# Patient Record
Sex: Male | Born: 1945
Health system: Western US, Academic
[De-identification: ages and names within clinical notes are randomized; demographics above are authoritative.]

---

## 2016-09-08 ENCOUNTER — Ambulatory Visit: Payer: MEDICARE

## 2016-09-08 DIAGNOSIS — H1851 Endothelial corneal dystrophy: Secondary | ICD-10-CM

## 2016-09-08 DIAGNOSIS — H259 Unspecified age-related cataract: Secondary | ICD-10-CM

## 2017-09-21 ENCOUNTER — Ambulatory Visit: Payer: BLUE CROSS/BLUE SHIELD

## 2021-03-16 IMAGING — MR MULTI PARAMETRIC MRI PROSTATE W/O AND W
13 series · 48 of 48 positions shown · IV contrast (gadavist)
Comparison: None

________________________________________________________________________________________________ 
MULTI PARAMETRIC MRI PROSTATE W/O AND W, 03/16/2021 [DATE]: 
CLINICAL INDICATION: Elevated PSA
TECHNIQUE: Multiple parametric sequences were performed. Patient was scanned on 
a 3T magnet. The patients eGFR was calculated to be 49.1 mL/min/1.73 m2 using 
the i-STAT device. 
Pre-contrast: T1 axial of the entire pelvis. T2 sagittal, axial and coronal, T1 
axial, acquired of the prostate. Diffusion with multiple B values of 3888, 5711 
calculated ADC value for mapping. 
Post contrast: Rapid sequence dynamic and axial planes through the prostate and 
seminal vesicles, T1 axial with fat sat of the entire pelvis. 3-D renderings 
were reconstructed on an independent workstation. The images were also evaluated 
with Dyna CAD computer aided detection. 8 of Gadavist were injected 
intravenously with the injector at a rate of 2.0 mL per second. As per [HOSPITAL] guidelines 3-D reconstructions are performed with 
concurrent physician supervision.

[Series 101: survey-include kidney(hilum) · axial · 10.0mm · 1.34mm/px · 1 of 14 slices shown]
[im 1/14]
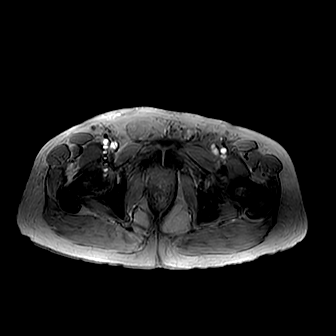

[Series 201: t1w_tse_ax · axial · 6.0mm · 0.36mm/px · 1 of 36 slices shown]
[im 1/36]
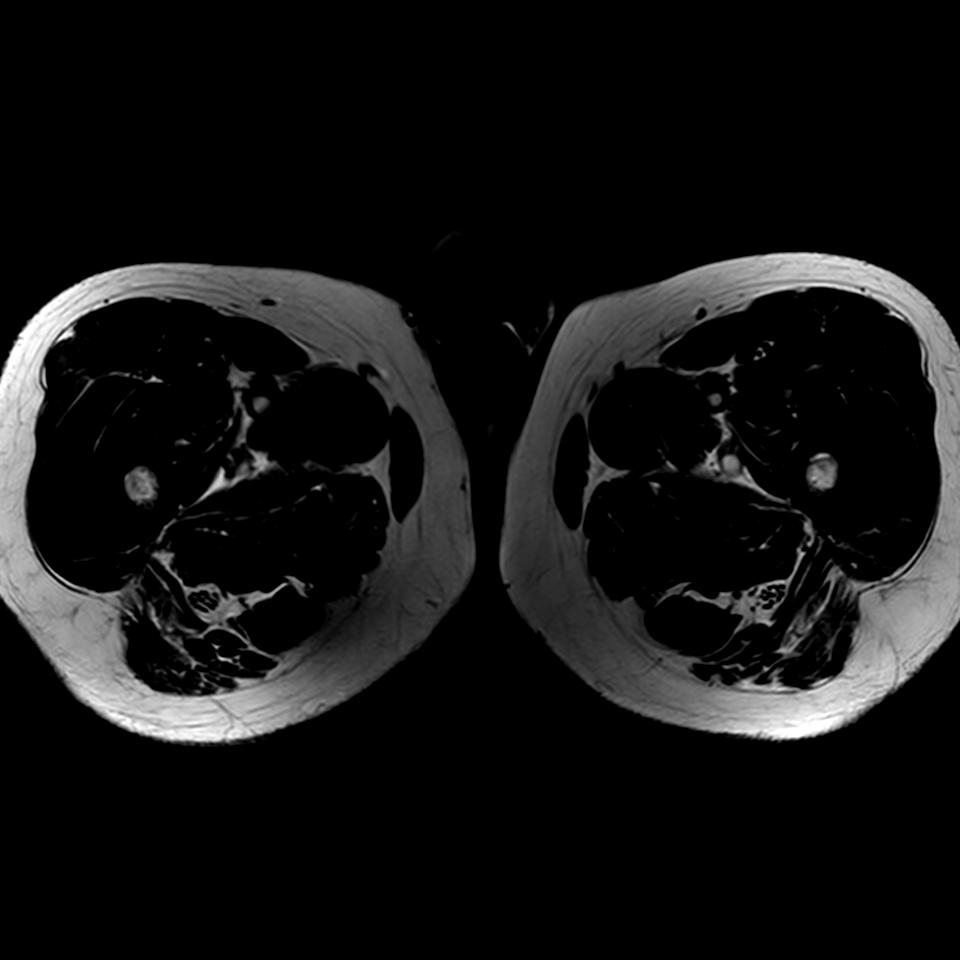

[Series 301: t2w sag · sagittal · 3.0mm · 0.45mm/px · 1 of 30 slices shown]
[im 1/30]
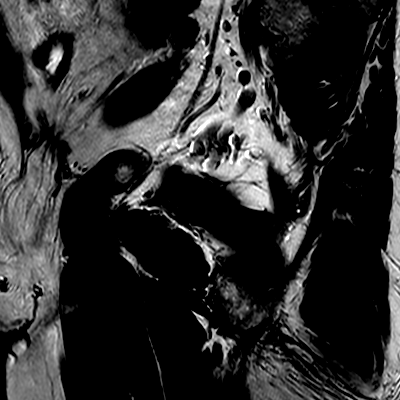

[Series 401: t2w cor · coronal · 3.0mm · 0.42mm/px · 1 of 30 slices shown]
[im 1/30]
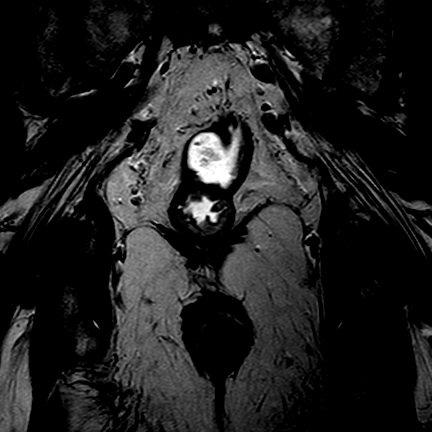

[Series 501: t2w ax · axial · 3.0mm · 0.27mm/px · 1 of 32 slices shown]
[im 1/32]
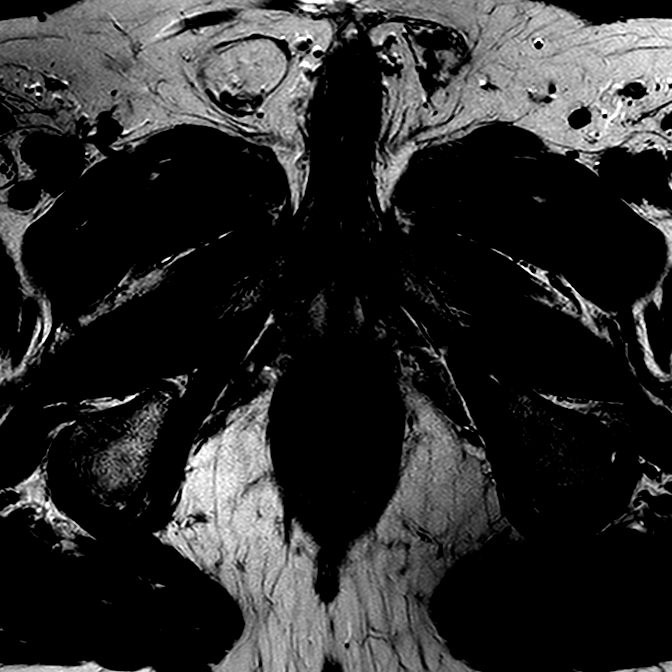

[Series 601: new-dwi_3b* 3mm* · axial · 3.0mm · 1.28mm/px · 1 of 64 slices shown]
[im 1/64]
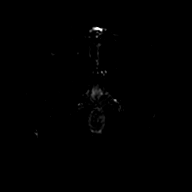

[Series 602: ADC · axial · 3.0mm · 1.28mm/px · 1 of 32 slices shown (1 of 2)]
[im 1/32]
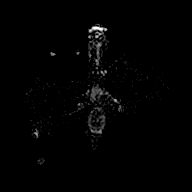

[Series 603: ADC · axial · 3.0mm · 1.28mm/px · 1 of 32 slices shown (2 of 2)]
[im 1/32]
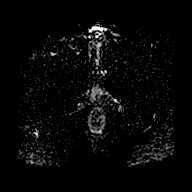

[Series 604: (id) · axial · 3.0mm · 1.28mm/px · 1 of 32 slices shown (1 of 3)]
[im 1/32]
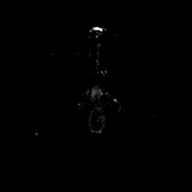

[Series 605: (id) · axial · 3.0mm · 1.28mm/px · 1 of 32 slices shown (2 of 3)]
[im 1/32]
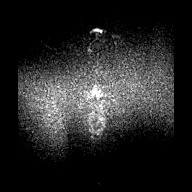

[Series 701: (id) · axial · 3.0mm · 1.28mm/px · 1 of 32 slices shown (3 of 3)]
[im 1/32]
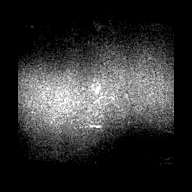

[Series 901: dyn 3mm*(ap) · axial · 3.0mm · 1.38mm/px · z∈[-45,+47]mm · 36 of 1440 slices shown]
[im 1/1440]
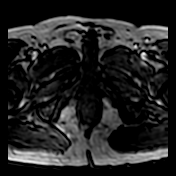
[im 42/1440]
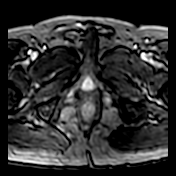
[im 83/1440]
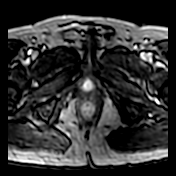
[im 124/1440]
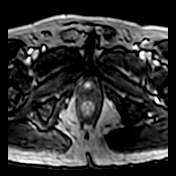
[im 165/1440]
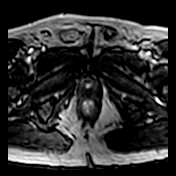
[im 206/1440]
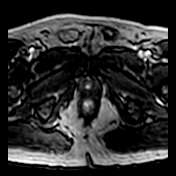
[im 247/1440]
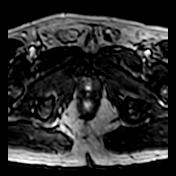
[im 288/1440]
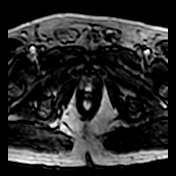
[im 329/1440]
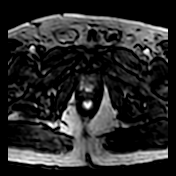
[im 371/1440]
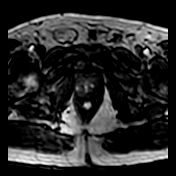
[im 412/1440]
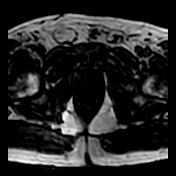
[im 453/1440]
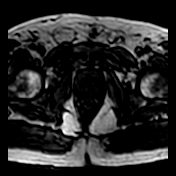
[im 494/1440]
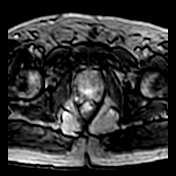
[im 535/1440]
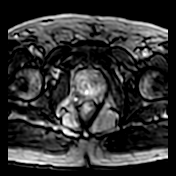
[im 576/1440]
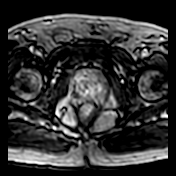
[im 617/1440]
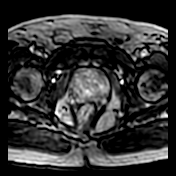
[im 658/1440]
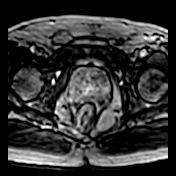
[im 699/1440]
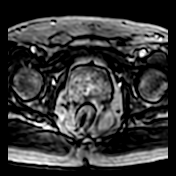
[im 741/1440]
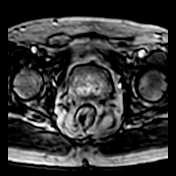
[im 782/1440]
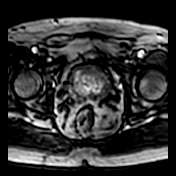
[im 823/1440]
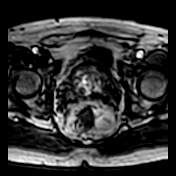
[im 864/1440]
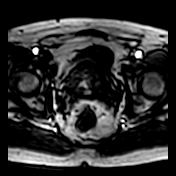
[im 905/1440]
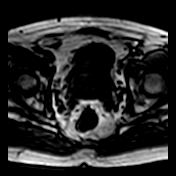
[im 946/1440]
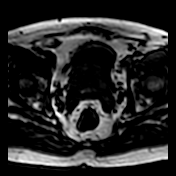
[im 987/1440]
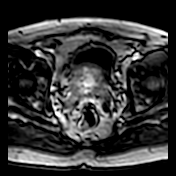
[im 1028/1440]
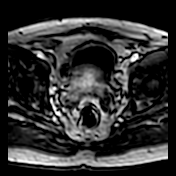
[im 1069/1440]
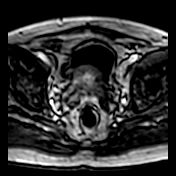
[im 1111/1440]
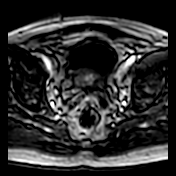
[im 1152/1440]
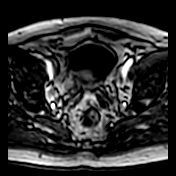
[im 1193/1440]
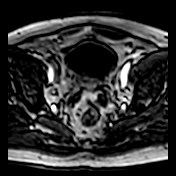
[im 1234/1440]
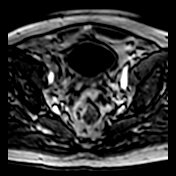
[im 1275/1440]
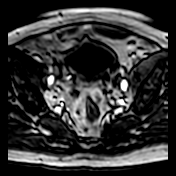
[im 1316/1440]
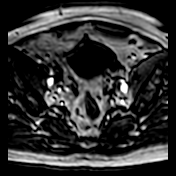
[im 1357/1440]
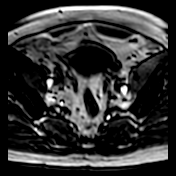
[im 1398/1440]
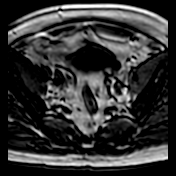
[im 1440/1440]
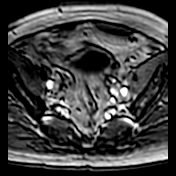

[Series 1002: DIXON · axial · 6.0mm · 0.51mm/px · 1 of 36 slices shown]
[im 1/36]
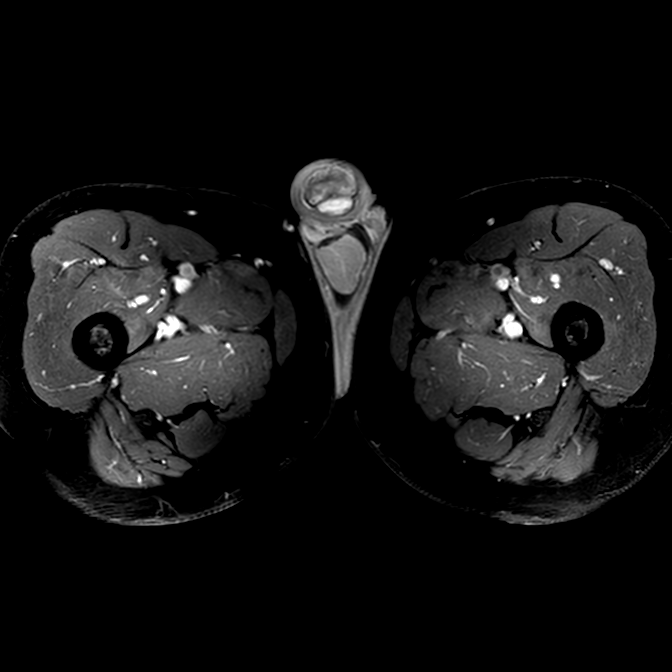

[48 of 48 positions shown; findings below may reference images not displayed]

FINDINGS: VOLUME: 89 cc 
CENTRAL GLAND: Enlarged and nodular in appearance. Elevates the bladder base. No 
areas of restricted diffusion. 
PERIPHERAL ZONE: There is a focus of decreased T2 signal within the lateral 
aspect of the right peripheral zone mid gland level. Measures upwards of 7 mm. 
There is restricted diffusion and flow with washout. Concerning for malignancy. 
PROSTATE CAPSULE: Intact. 
MUSCLE SIDE WALLS: Normal in appearance. 
SEMINAL VESICLES: Normal in appearance. 
BLADDER: Bladder diverticula are identified. Trabeculated bladder. 
LYMPHADENOPATHY: None 
BONES: No osseous abnormality identified. 
ADDITIONAL FINDINGS: Diverticulosis. Degenerative changes.
IMPRESSION: Findings concerning for malignancy right peripheral zone without extracapsular 
extension. 
Bladder diverticula. 
(PI-RADS 4): High (clinically significant cancer is likely to be present).

## 2021-09-25 ENCOUNTER — Ambulatory Visit: Payer: BLUE CROSS/BLUE SHIELD

## 2021-09-25 DIAGNOSIS — J029 Acute pharyngitis, unspecified: Secondary | ICD-10-CM

## 2021-09-25 MED ORDER — AMOXICILLIN-POT CLAVULANATE 875-125 MG PO TABS
1 | ORAL_TABLET | Freq: Two times a day (BID) | ORAL | 0 refills | Status: AC
Start: 2021-09-25 — End: ?

## 2021-09-25 NOTE — Progress Notes
[facility]  VISIT DATE: 09/25/2021    MRN: [id number]  DOB: [birth year]       SUBJECTIVE   Derek Hughes is a 76 y.o. male had concerns including URI (X couple weeks. ), Cough, Sore Throat, and Conjunctivitis (Unilateral ).  Day of incident/onset: 2 weeks  []  Nasal congestion  [x]  Cough  []  Fever  [x]  Sore throat, intermittent   []  SOB/CP   []  Abdominal symptoms  COVID testing: none  Having red eyes with discharge since yesterday    ROS: 14 points ROS non-contributory/neg except noted as above.    ALLX Reviewed  MEDS Reviewed      OBJECTIVE   VS: BP 144/83  ~ Pulse 69  ~ Temp 36.8 ?C (98.2 ?F) (Tympanic)  ~ Wt 182 lb (82.6 kg)  ~ SpO2 97%     General: Well developed, well nourished, in no acute discomfort  HEENT: EOMI  Psychologic:  Normal speech, mood, affect, intact memory  Respiratory: CTAB, no wheezing, good air exchange, breathing non labored and non dyspneic  Cardiovascular: no peripheral edema   Gastrointestinal: non-distended  Skin: No visible rashes    Labs Reviewed:  Results for orders placed or performed in visit on 09/25/21   POCT rapid strep A   Result Value Ref Range    Rapid Strep A Screen, Manual Negative Negative         ASSESSMENT & PLAN     1. Upper respiratory tract infection, unspecified type  - POCT rapid strep A; Future  - POCT rapid strep A  - amoxicillin-clavulanate 875-125 mg tablet; Take 1 tablet by mouth two (2) times daily for 7 days.  Dispense: 14 tablet; Refill: 0  - COVID-19  PCR/TMA, (Self) Mid-turbinate; Future  Vitals wnl except mildly elevated BP  Presentation less concerning for pneumonia, restrictive airway disease/exacerbation, AOM, malignant otitis externa, mastoiditis, PTA/RPA. No AMS, silent respirations, belly-breathing, or other sign of impending ventilatory failure.    Rapid strep negative  Given chronicity and persistence, suspecting bacterial infection  Rx Augmentin  COVID test pending - isolate until results available  Supportive care - encouraged fluids, OTC analgesics/antipyretics, guaifenesin and/or nasal saline rinse for nasal congestion  Return to IC or f/u PCP if symptoms persist or worsen  Further precautions as below     ER and return precautions discussed:  If showing any of these signs, seek emergency medical care immediately  - Trouble breathing  - Persistent pain or pressure in the chest  - Pain out of proportion  - New confusion  - Inability to wake or stay awake  - Pale, gray, or blue-colored skin, lips, or nail beds, depending on skin tone  This list is not all possible symptoms. Please contact your doctor for any other symptoms that are severe or concerning to you.     The above plan of care, diagnosis, orders, and follow-up were discussed with the patient, and the patient demonstrated full understanding. Questions related to this recommended plan of care were answered.    Emberley Kral, DO  Family Medicine/Immediate Care

## 2021-09-26 LAB — COVID-19 PCR/TMA

## 2023-04-01 IMAGING — MR MULTI PARAMETRIC MRI PROSTATE W/O AND W
13 series · 48 of 48 positions shown · IV contrast (gadavist)
Comparison: March 2021

________________________________________________________________________________________________ 
MULTI PARAMETRIC MRI PROSTATE W/O AND W, 04/01/2023 [DATE]: 
CLINICAL INDICATION: Elevated prostate specific antigen [PSA]
TECHNIQUE: Multiple parametric sequences were performed Pre-contrast: T1 axial 
of the entire pelvis. T2 sagittal, axial  and coronal,T1 axial, acquired of the 
prostate. Diffusion with multiple B values of 7000, 4844 calculated ADC value 
for mapping. Post contrast: Rapid sequence dynamic and axial planes through the 
prostate and seminal vesicles,T1 axial with fat sat of the entire pelvis. 3-D 
renderings were reconstructed on an independent workstation. The images were 
also evaluated with Dyna CAD computer aided detection. 8.5 mL of Gadavist were 
injected intravenously. 1.5 mL of Gadavist discarded. As per [HOSPITAL] guidelines 3D reconstructions are performed with concurrent physician 
supervision. Patient was scanned on a 3T magnet.

[Series 101: survey-mst · axial · 10.0mm · 1.34mm/px · 1 of 14 slices shown]
[im 1/14]
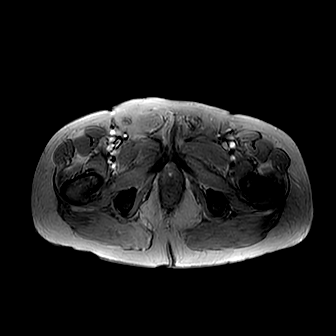

[Series 201: t1w_tse_ax · axial · 6.0mm · 0.37mm/px · 1 of 36 slices shown]
[im 1/36]
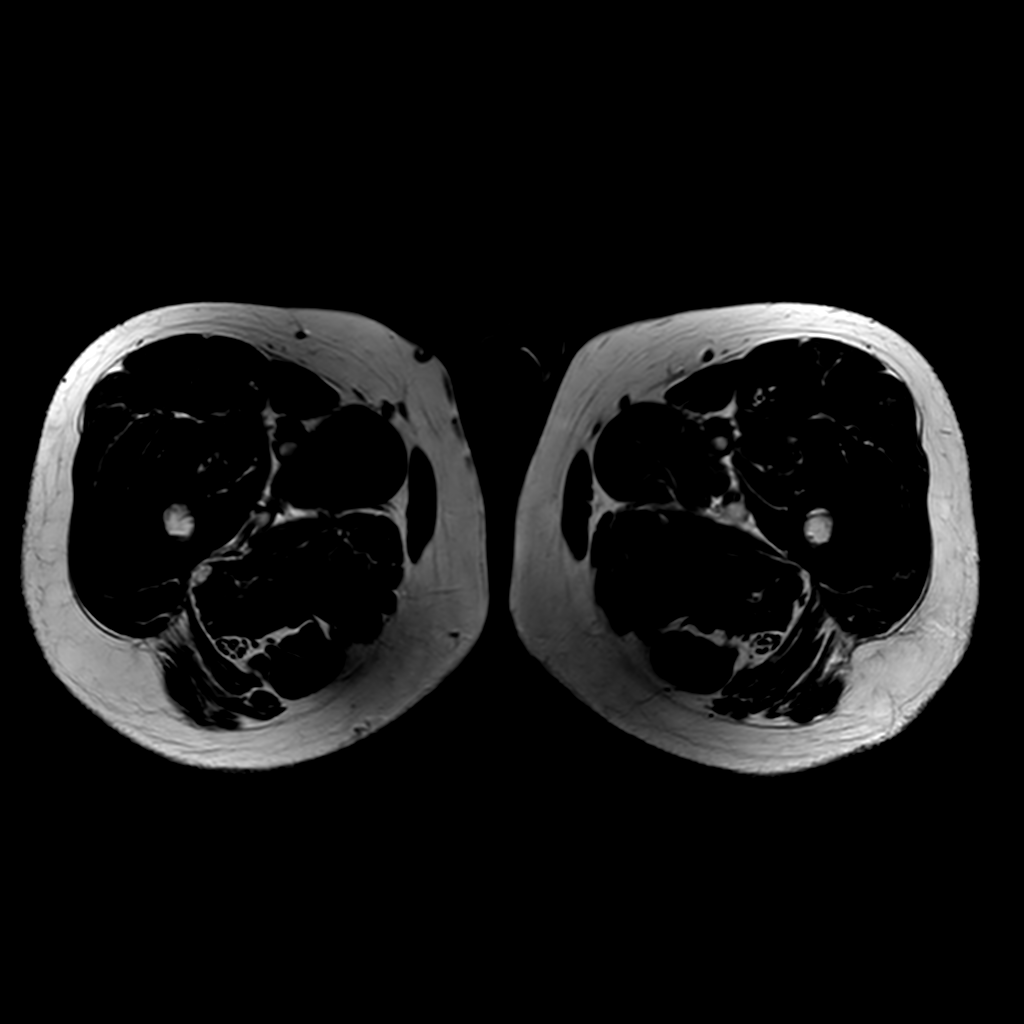

[Series 301: t2w sag · sagittal · 3.0mm · 0.42mm/px · 1 of 32 slices shown]
[im 1/32]
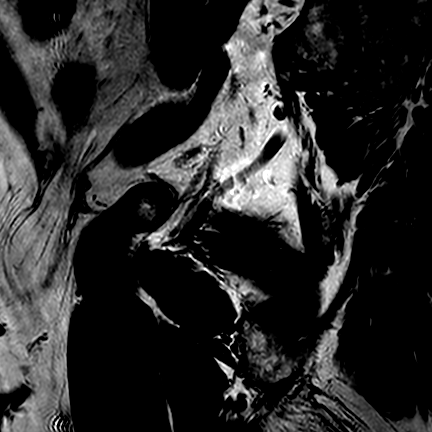

[Series 401: t2w cor · coronal · 3.0mm · 0.42mm/px · 1 of 30 slices shown]
[im 1/30]
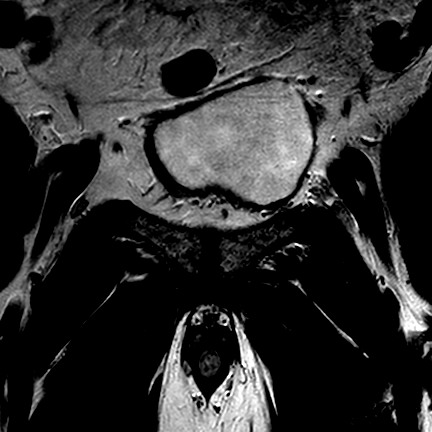

[Series 501: t2w ax · axial · 3.0mm · 0.38mm/px · 1 of 32 slices shown]
[im 1/32]
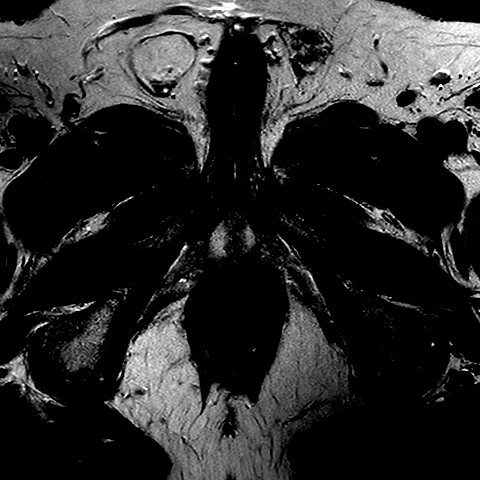

[Series 601: new-dwi_3b* 3mm* · axial · 3.0mm · 1.28mm/px · 1 of 64 slices shown]
[im 1/64]
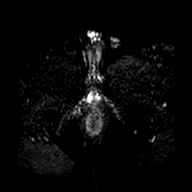

[Series 602: ADC · axial · 3.0mm · 1.28mm/px · 1 of 32 slices shown (1 of 2)]
[im 1/32]
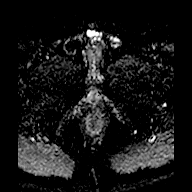

[Series 603: ADC · axial · 3.0mm · 1.28mm/px · 1 of 32 slices shown (2 of 2)]
[im 1/32]
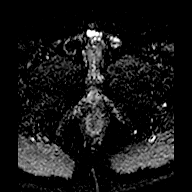

[Series 604: (id) · axial · 3.0mm · 1.28mm/px · 1 of 32 slices shown (1 of 3)]
[im 1/32]
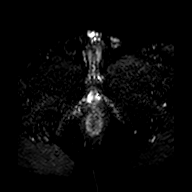

[Series 605: (id) · axial · 3.0mm · 1.28mm/px · 1 of 32 slices shown (2 of 3)]
[im 1/32]
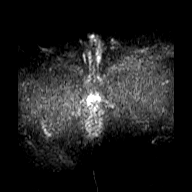

[Series 701: (id) · axial · 3.0mm · 1.28mm/px · 1 of 32 slices shown (3 of 3)]
[im 1/32]
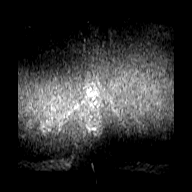

[Series 901: dyn 3mm*(ap) · axial · 3.0mm · 1.38mm/px · z∈[-26,+67]mm · 36 of 1440 slices shown]
[im 1/1440]
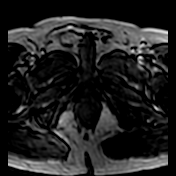
[im 42/1440]
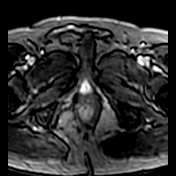
[im 83/1440]
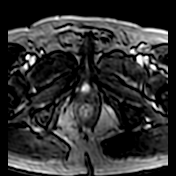
[im 124/1440]
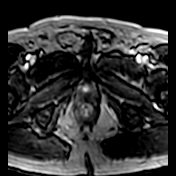
[im 165/1440]
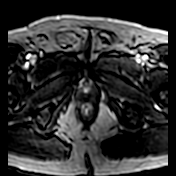
[im 206/1440]
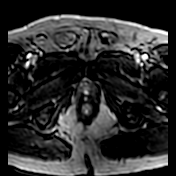
[im 247/1440]
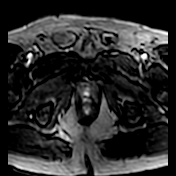
[im 288/1440]
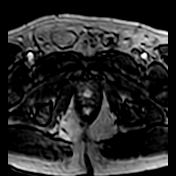
[im 329/1440]
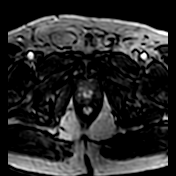
[im 371/1440]
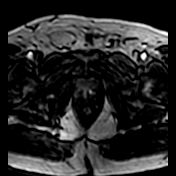
[im 412/1440]
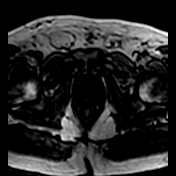
[im 453/1440]
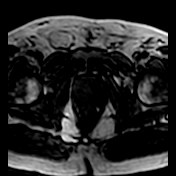
[im 494/1440]
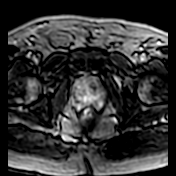
[im 535/1440]
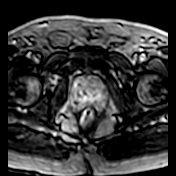
[im 576/1440]
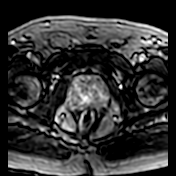
[im 617/1440]
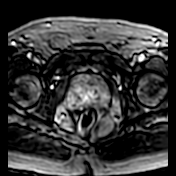
[im 658/1440]
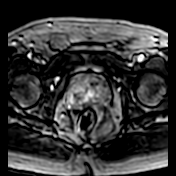
[im 699/1440]
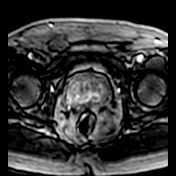
[im 741/1440]
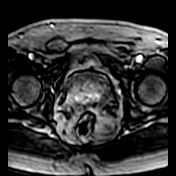
[im 782/1440]
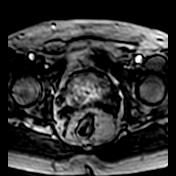
[im 823/1440]
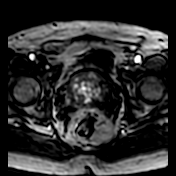
[im 864/1440]
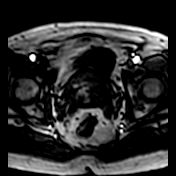
[im 905/1440]
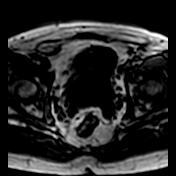
[im 946/1440]
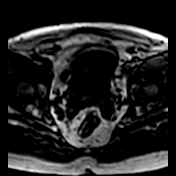
[im 987/1440]
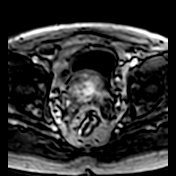
[im 1028/1440]
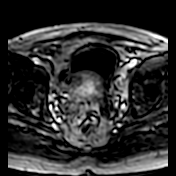
[im 1069/1440]
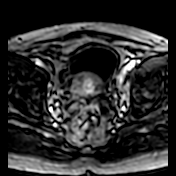
[im 1111/1440]
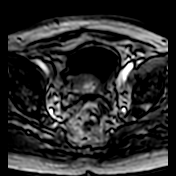
[im 1152/1440]
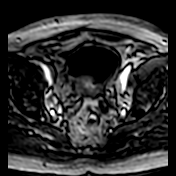
[im 1193/1440]
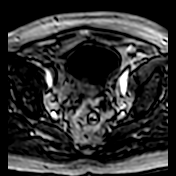
[im 1234/1440]
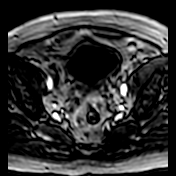
[im 1275/1440]
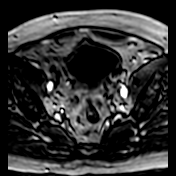
[im 1316/1440]
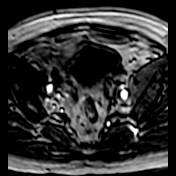
[im 1357/1440]
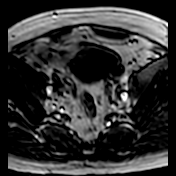
[im 1398/1440]
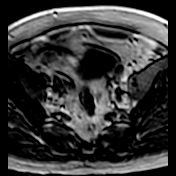
[im 1440/1440]
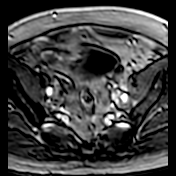

[Series 1002: DIXON · axial · 6.0mm · 0.49mm/px · 1 of 36 slices shown]
[im 1/36]
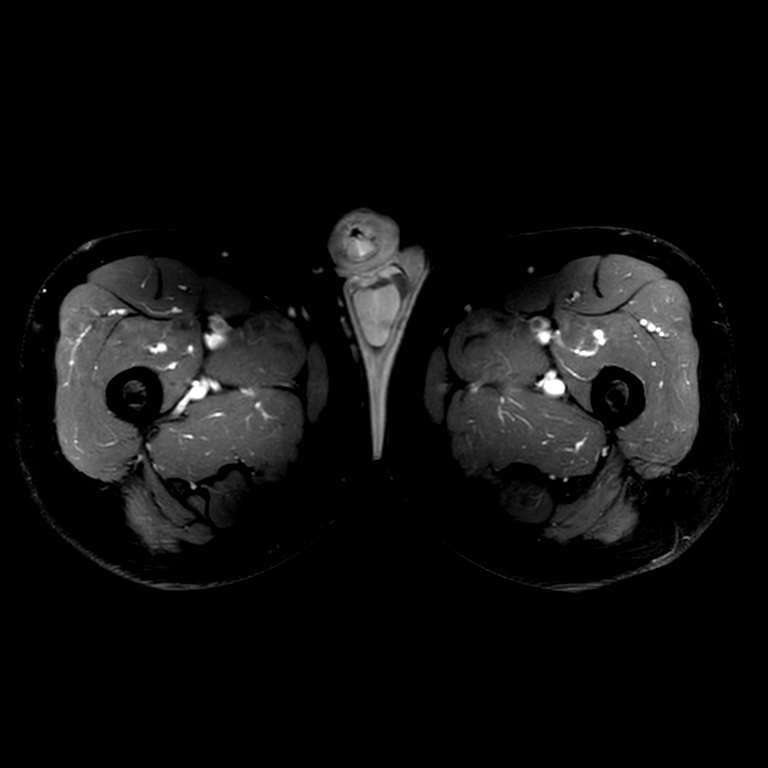

[48 of 48 positions shown; findings below may reference images not displayed]

FINDINGS: VOLUME: 103.3 cc 
CENTRAL GLAND: Nodular in appearance. Elevates the bladder base. No focal area 
suspicious for malignancy. 
PERIPHERAL ZONE: Enlarging area concerning for malignancy within the right 
lateral peripheral zone just below the base. Previously this was measured at
cm, currently at 1.5 cm with significant restricted diffusion. 
PROSTATE CAPSULE: Poor definition of the capsule without definite extracapsular 
soft tissue identified. 
MUSCLE SIDE WALLS: Normal in appearance. 
SEMINAL VESICLES: Normal in appearance. 
BLADDER: Bilateral bladder diverticula. 
LYMPHADENOPATHY: No new or developing adenopathy. 
BONES: No osseous lesion seen. 
ADDITIONAL FINDINGS: Fat-containing right inguinal hernia.
IMPRESSION: Enlarging suspicious lesion right lateral peripheral zone just below the base. 
Fat-containing right inguinal hernia. 
(PI-RADS 5): Very high (clinically significant cancer is highly likely to be 
present).
